# Patient Record
Sex: Female | Born: 1940
Health system: Southern US, Community
[De-identification: ages and names within clinical notes are randomized; demographics above are authoritative.]

## PROBLEM LIST (undated history)

## (undated) DIAGNOSIS — E538 Deficiency of other specified B group vitamins: Secondary | ICD-10-CM

## (undated) DIAGNOSIS — H4010X0 Unspecified open-angle glaucoma, stage unspecified: Secondary | ICD-10-CM

## (undated) DIAGNOSIS — J45909 Unspecified asthma, uncomplicated: Secondary | ICD-10-CM

## (undated) DIAGNOSIS — D649 Anemia, unspecified: Secondary | ICD-10-CM

## (undated) DIAGNOSIS — E119 Type 2 diabetes mellitus without complications: Secondary | ICD-10-CM

## (undated) DIAGNOSIS — M6520 Calcific tendinitis, unspecified site: Secondary | ICD-10-CM

---

## 2019-09-06 ENCOUNTER — Other Ambulatory Visit: Payer: Self-pay

## 2019-09-06 ENCOUNTER — Emergency Department (HOSPITAL_BASED_OUTPATIENT_CLINIC_OR_DEPARTMENT_OTHER)
Admission: EM | Admit: 2019-09-06 | Discharge: 2019-09-06 | Disposition: A | Discharge status: Home / Self Care | Payer: Medicare Other | Attending: Emergency Medicine | Admitting: Emergency Medicine

## 2019-09-06 ENCOUNTER — Encounter (HOSPITAL_BASED_OUTPATIENT_CLINIC_OR_DEPARTMENT_OTHER): Payer: Self-pay | Admitting: *Deleted

## 2019-09-06 DIAGNOSIS — T7840XA Allergy, unspecified, initial encounter: Secondary | ICD-10-CM | POA: Diagnosis present

## 2019-09-06 DIAGNOSIS — Z7982 Long term (current) use of aspirin: Secondary | ICD-10-CM | POA: Insufficient documentation

## 2019-09-06 DIAGNOSIS — Z79899 Other long term (current) drug therapy: Secondary | ICD-10-CM | POA: Diagnosis not present

## 2019-09-06 DIAGNOSIS — R220 Localized swelling, mass and lump, head: Secondary | ICD-10-CM | POA: Insufficient documentation

## 2019-09-06 HISTORY — DX: Anemia, unspecified: D64.9

## 2019-09-06 HISTORY — DX: Unspecified open-angle glaucoma, stage unspecified: H40.10X0

## 2019-09-06 HISTORY — DX: Type 2 diabetes mellitus without complications: E11.9

## 2019-09-06 HISTORY — DX: Calcific tendinitis, unspecified site: M65.20

## 2019-09-06 HISTORY — DX: Unspecified asthma, uncomplicated: J45.909

## 2019-09-06 HISTORY — DX: Deficiency of other specified B group vitamins: E53.8

## 2019-09-06 MED ORDER — DIPHENHYDRAMINE HCL 25 MG PO CAPS
25.0000 mg | ORAL_CAPSULE | Freq: Once | ORAL | Status: AC
  Administered 2019-09-06: 25 mg via ORAL
  Filled 2019-09-06: qty 1

## 2019-09-06 MED ORDER — DEXAMETHASONE 4 MG PO TABS
8.0000 mg | ORAL_TABLET | Freq: Once | ORAL | Status: AC
  Administered 2019-09-06: 8 mg via ORAL
  Filled 2019-09-06: qty 2

## 2019-09-06 NOTE — Discharge Instructions (Addendum)
Stop taking lisinopril. This class of blood pressure medication can sometimes cause a condition called angioedema. I can't tell you for sure that it is the reason for your lip swelling earlier today, but it's a possibility. You are on a low dose anyway and I do not want you taking it anymore. You need to talk with your doctor as they may want to put you on an alternative medication.   You can take benadryl if you feel like you have continued symptoms. Return to the ER if you feel like your symptoms are significantly worsening.

## 2019-09-06 NOTE — ED Provider Notes (Signed)
[facility] EMERGENCY DEPARTMENT Provider Note   CSN: [id number] Arrival date & time: 09/06/19  5038     History Chief Complaint  Patient presents with  . Allergic Reaction    Lisa Powell is a 79 y.o. female.  HPI   79 year old female with lip swelling.  She is concerned that she may be having allergic reaction.  She ate some shrimp last night although she has had shrimp on multiple other occasions without any problems.  She put some Benadryl cream on her lip and has since improved.  No sore throat or throat tightness.  No coughing or wheezing.  No GI complaints.  No dizziness or lightheadedness.  No other new/different exposures that she is aware of.  She is on an ACE inhibitor. Past Medical History:  Diagnosis Date  . Anemia reactive airway disease  . Calcific tendonitis   . Diabetes mellitus without complication (HCC)   . Open-angle glaucoma   . Reactive airway disease   . Vitamin B12 deficiency     There are no problems to display for this patient.   History reviewed. No pertinent surgical history.   OB History    Gravida  3   Para  2   Term      Preterm      AB  1   Living        SAB      TAB      Ectopic      Multiple      Live Births              Family History  Problem Relation Age of Onset  . Cancer Mother   . Diabetes Father     Social History   Tobacco Use  . Smoking status: Never Smoker  . Smokeless tobacco: Never Used  Substance Use Topics  . Alcohol use: Never  . Drug use: Never    Home Medications Prior to Admission medications   Medication Sig Start Date End Date Taking? Authorizing Provider  aspirin EC 81 MG tablet Take 81 mg by mouth daily.   Yes [provider]  atorvastatin (LIPITOR) 40 MG tablet Take 40 mg by mouth daily.   Yes [provider]  azelastine (OPTIVAR) 0.05 % ophthalmic solution 1 drop 2 (two) times daily.   Yes [provider]  ferrous sulfate 325 (65 FE) MG  tablet Take 325 mg by mouth daily with breakfast.   Yes [provider]  fluticasone (FLONASE) 50 MCG/ACT nasal spray Place into both nostrils daily.   Yes [provider]  latanoprost (XALATAN) 0.005 % ophthalmic solution 1 drop at bedtime.   Yes [provider]  lisinopril (ZESTRIL) 5 MG tablet Take 5 mg by mouth daily.   Yes [provider]  metFORMIN (GLUCOPHAGE) 1000 MG tablet Take 1,000 mg by mouth 2 (two) times daily with a meal.    [provider]  montelukast (SINGULAIR) 10 MG tablet Take 10 mg by mouth at bedtime.    [provider]    Allergies    Patient has no known allergies.  Review of Systems   Review of Systems All systems reviewed and negative, other than as noted in HPI.  Physical Exam Updated Vital Signs BP (!) 170/67 (BP Location: Right Arm)   Pulse 87   Temp 98.7 F (37.1 C) (Oral)   Resp 16   Ht 5\' 4"  (1.626 m)   Wt 68.1 kg   SpO2 99%  BMI 25.76 kg/m   Physical Exam Vitals and nursing note reviewed.  Constitutional:      General: She is not in acute distress.    Appearance: She is well-developed.  HENT:     Head: Normocephalic and atraumatic.     Mouth/Throat:     Comments: Oropharynx is clear.  No swelling appreciated on face or neck.  Normal sounding voice.  No stridor.  Neck supple. Eyes:     General:        Right eye: No discharge.        Left eye: No discharge.     Conjunctiva/sclera: Conjunctivae normal.  Cardiovascular:     Rate and Rhythm: Normal rate and regular rhythm.     Heart sounds: Normal heart sounds. No murmur. No friction rub. No gallop.   Pulmonary:     Effort: Pulmonary effort is normal. No respiratory distress.     Breath sounds: Normal breath sounds.  Abdominal:     General: There is no distension.     Palpations: Abdomen is soft.     Tenderness: There is no abdominal tenderness.  Musculoskeletal:        General: No tenderness.     Cervical back: Neck supple.    Skin:    General: Skin is warm and dry.  Neurological:     Mental Status: She is alert.  Psychiatric:        Behavior: Behavior normal.        Thought Content: Thought content normal.     ED Results / Procedures / Treatments   Labs (all labs ordered are listed, but only abnormal results are displayed) Labs Reviewed - No data to display  EKG None  Radiology No results found.  Procedures Procedures (including critical care time)  Medications Ordered in ED Medications - No data to display  ED Course  I have reviewed the triage vital signs and the nursing notes.  Pertinent labs & imaging results that were available during my care of the patient were reviewed by me and considered in my medical decision making (see chart for details).    MDM Rules/Calculators/A&P                      79 year old female with lip swelling.  She states that it is improved.  I do not appreciate any whatsoever on my exam.  Overall her exam is reassuring/normal.  Advise she continue to take Benadryl as needed.  Of note, she is on an ACE inhibitor.  She is on a small dose.  Advised her to just stop at this point as her lip swelling may potentially be angioedema related to this.  I cannot tell her for sure.  Advised her to follow-up with her PCP to discuss it further.  Final Clinical Impression(s) / ED Diagnoses Final diagnoses:  Lip swelling    Rx / DC Orders ED Discharge Orders    None       Virgel Manifold, MD 09/08/19 1153

## 2019-09-06 NOTE — ED Triage Notes (Signed)
Ate 2 shrimps last night and woke up with swollen lips and she applied Benadryl cream and the swollen has subsided.

## 2020-03-12 ENCOUNTER — Encounter (HOSPITAL_BASED_OUTPATIENT_CLINIC_OR_DEPARTMENT_OTHER): Payer: Self-pay

## 2020-03-12 ENCOUNTER — Other Ambulatory Visit: Payer: Self-pay

## 2020-03-12 ENCOUNTER — Emergency Department (HOSPITAL_BASED_OUTPATIENT_CLINIC_OR_DEPARTMENT_OTHER)
Admission: EM | Admit: 2020-03-12 | Discharge: 2020-03-12 | Disposition: A | Discharge status: Home / Self Care | Payer: No Typology Code available for payment source | Attending: Emergency Medicine | Admitting: Emergency Medicine

## 2020-03-12 ENCOUNTER — Emergency Department (HOSPITAL_BASED_OUTPATIENT_CLINIC_OR_DEPARTMENT_OTHER): Payer: No Typology Code available for payment source

## 2020-03-12 DIAGNOSIS — E119 Type 2 diabetes mellitus without complications: Secondary | ICD-10-CM | POA: Insufficient documentation

## 2020-03-12 DIAGNOSIS — Y92410 Unspecified street and highway as the place of occurrence of the external cause: Secondary | ICD-10-CM | POA: Insufficient documentation

## 2020-03-12 DIAGNOSIS — M4812 Ankylosing hyperostosis [Forestier], cervical region: Secondary | ICD-10-CM | POA: Insufficient documentation

## 2020-03-12 DIAGNOSIS — S199XXA Unspecified injury of neck, initial encounter: Secondary | ICD-10-CM | POA: Diagnosis present

## 2020-03-12 DIAGNOSIS — Y9389 Activity, other specified: Secondary | ICD-10-CM | POA: Insufficient documentation

## 2020-03-12 DIAGNOSIS — M4810 Ankylosing hyperostosis [Forestier], site unspecified: Secondary | ICD-10-CM

## 2020-03-12 DIAGNOSIS — M545 Low back pain, unspecified: Secondary | ICD-10-CM

## 2020-03-12 DIAGNOSIS — Y999 Unspecified external cause status: Secondary | ICD-10-CM | POA: Diagnosis not present

## 2020-03-12 DIAGNOSIS — S161XXA Strain of muscle, fascia and tendon at neck level, initial encounter: Secondary | ICD-10-CM

## 2020-03-12 MED ORDER — CYCLOBENZAPRINE HCL 10 MG PO TABS: 10.0000 mg | ORAL_TABLET | Freq: Two times a day (BID) | ORAL | 0 refills | Status: DC | PRN

## 2020-03-12 MED ORDER — LIDOCAINE 5 % EX PTCH: 1.0000 | MEDICATED_PATCH | CUTANEOUS | 0 refills | Status: DC

## 2020-03-12 MED FILL — LIDOCAINE PATCH 5%: 5 | 30 days supply | Qty: 30 | Fill #0

## 2020-03-12 MED FILL — CYCLOBENZAPRINE HCL 10 MG T: 10 | 10 days supply | Qty: 20 | Fill #0
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
## 2022-06-02 ENCOUNTER — Other Ambulatory Visit: Payer: Self-pay

## 2022-06-02 ENCOUNTER — Emergency Department (HOSPITAL_BASED_OUTPATIENT_CLINIC_OR_DEPARTMENT_OTHER): Payer: Medicare Other

## 2022-06-02 ENCOUNTER — Emergency Department (HOSPITAL_BASED_OUTPATIENT_CLINIC_OR_DEPARTMENT_OTHER)
Admission: EM | Admit: 2022-06-02 | Discharge: 2022-06-02 | Disposition: A | Discharge status: Home / Self Care | Payer: Medicare Other | Attending: Emergency Medicine | Admitting: Emergency Medicine

## 2022-06-02 ENCOUNTER — Encounter (HOSPITAL_BASED_OUTPATIENT_CLINIC_OR_DEPARTMENT_OTHER): Payer: Self-pay

## 2022-06-02 DIAGNOSIS — R791 Abnormal coagulation profile: Secondary | ICD-10-CM | POA: Insufficient documentation

## 2022-06-02 DIAGNOSIS — Z7982 Long term (current) use of aspirin: Secondary | ICD-10-CM | POA: Insufficient documentation

## 2022-06-02 DIAGNOSIS — I10 Essential (primary) hypertension: Secondary | ICD-10-CM | POA: Diagnosis not present

## 2022-06-02 DIAGNOSIS — Z7984 Long term (current) use of oral hypoglycemic drugs: Secondary | ICD-10-CM | POA: Insufficient documentation

## 2022-06-02 DIAGNOSIS — E119 Type 2 diabetes mellitus without complications: Secondary | ICD-10-CM | POA: Diagnosis not present

## 2022-06-02 DIAGNOSIS — Z79899 Other long term (current) drug therapy: Secondary | ICD-10-CM | POA: Diagnosis not present

## 2022-06-02 DIAGNOSIS — R0789 Other chest pain: Secondary | ICD-10-CM | POA: Insufficient documentation

## 2022-06-02 DIAGNOSIS — R079 Chest pain, unspecified: Secondary | ICD-10-CM

## 2022-06-02 LAB — CBC
HCT: 30.6 % — ABNORMAL LOW (ref 36.0–46.0)
Hemoglobin: 10.3 g/dL — ABNORMAL LOW (ref 12.0–15.0)
MCH: 30.6 pg (ref 26.0–34.0)
MCHC: 33.7 g/dL (ref 30.0–36.0)
MCV: 90.8 fL (ref 80.0–100.0)
Platelets: 316 10*3/uL (ref 150–400)
RBC: 3.37 MIL/uL — ABNORMAL LOW (ref 3.87–5.11)
RDW: 13.4 % (ref 11.5–15.5)
WBC: 5.7 10*3/uL (ref 4.0–10.5)
nRBC: 0 % (ref 0.0–0.2)

## 2022-06-02 LAB — BASIC METABOLIC PANEL
Anion gap: 6 (ref 5–15)
BUN: 14 mg/dL (ref 8–23)
CO2: 27 mmol/L (ref 22–32)
Calcium: 9 mg/dL (ref 8.9–10.3)
Chloride: 108 mmol/L (ref 98–111)
Creatinine, Ser: 0.8 mg/dL (ref 0.44–1.00)
GFR, Estimated: >60 mL/min (ref >60)
Glucose, Bld: 189 mg/dL — ABNORMAL HIGH (ref 70–99)
Potassium: 3.8 mmol/L (ref 3.5–5.1)
Sodium: 141 mmol/L (ref 135–145)

## 2022-06-02 LAB — TROPONIN I (HIGH SENSITIVITY)
Troponin I (High Sensitivity): 4 ng/L (ref <18)
Troponin I (High Sensitivity): 4 ng/L (ref <18)

## 2022-06-02 LAB — D-DIMER, QUANTITATIVE: D-Dimer, Quant: 2.46 ug/mL-FEU — ABNORMAL HIGH (ref 0.00–0.50)

## 2022-06-02 MED ORDER — ASPIRIN 81 MG PO CHEW
324.0000 mg | CHEWABLE_TABLET | Freq: Once | ORAL | Status: AC
  Administered 2022-06-02: 324 mg via ORAL
  Filled 2022-06-02: qty 4

## 2022-06-02 MED ORDER — IOHEXOL 350 MG/ML SOLN
75.0000 mL | Freq: Once | INTRAVENOUS | Status: AC | PRN
  Administered 2022-06-02: 75 mL via INTRAVENOUS

## 2022-06-02 MED ORDER — ALUM & MAG HYDROXIDE-SIMETH 200-200-20 MG/5ML PO SUSP
30.0000 mL | Freq: Once | ORAL | Status: AC
  Administered 2022-06-02: 30 mL via ORAL
  Filled 2022-06-02: qty 30

## 2022-06-02 NOTE — Discharge Instructions (Signed)
Try pepcid or tagamet up to twice a day.  Try to avoid things that may make this worse, most commonly these are spicy foods tomato based products fatty foods chocolate and peppermint.  Alcohol and tobacco can also make this worse.  Return to the emergency department for sudden worsening pain fever or inability to eat or drink.  Follow up with your doctor in the office.  Return for worsening symptoms.
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
## 2022-06-02 NOTE — ED Notes (Signed)
Attempted PIVx2 unsuccessfully.

## 2022-06-02 NOTE — ED Notes (Signed)
Per CT IV became infiltrated and needs to be restarted which I did

## 2022-06-02 NOTE — ED Provider Notes (Signed)
[facility] EMERGENCY DEPARTMENT Provider Note   CSN: [id number] Arrival date & time: 06/02/22  5573     History  Chief Complaint  Patient presents with   Chest Pain    Lisa Powell is a 81 y.o. female.  81 yo F with a chief complaint of chest pain.  This woke her from sleep about 2 AM.  It like a discomfort across her chest.  No radiation no shortness of breath nausea vomiting no diaphoresis.  She got up out of bed and walk around and got a little bit better.  It has persisted.  Nothing seems to make it better or worse now.  Denies pain like this previously.  Patient denies history of MI, denies smoking.  Denies family history of MI.  Patient has a history of hypertension hyperlipidemia and diabetes.  Patient denies history of PE or DVT denies hemoptysis denies unilateral lower extremity edema denies recent surgery immobilization hospitalization estrogen use or history of cancer.  After the patient told me that she has not had any recent surgeries tell me that she just had cataracts removed about 3 weeks ago.     Chest Pain      Home Medications Prior to Admission medications   Medication Sig Start Date End Date Taking? Authorizing Provider  aspirin EC 81 MG tablet Take 81 mg by mouth daily.    [provider]  atorvastatin (LIPITOR) 40 MG tablet Take 40 mg by mouth daily.    [provider]  azelastine (OPTIVAR) 0.05 % ophthalmic solution 1 drop 2 (two) times daily.    [provider]  cyclobenzaprine (FLEXERIL) 10 MG tablet Take 1 tablet (10 mg total) by mouth 2 (two) times daily as needed for muscle spasms. 03/12/20   Alvira Monday, MD  ferrous sulfate 325 (65 FE) MG tablet Take 325 mg by mouth daily with breakfast.    [provider]  fluticasone (FLONASE) 50 MCG/ACT nasal spray Place into both nostrils daily.    [provider]  latanoprost (XALATAN) 0.005 % ophthalmic solution 1 drop at bedtime.    [provider]  lidocaine (LIDODERM) 5 % Place 1 patch onto the skin daily. Remove & Discard patch within 12 hours or as directed by MD 03/12/20   Alvira Monday, MD  lisinopril (ZESTRIL) 5 MG tablet Take 5 mg by mouth daily.    [provider]  metFORMIN (GLUCOPHAGE) 1000 MG tablet Take 1,000 mg by mouth 2 (two) times daily with a meal.    [provider]  montelukast (SINGULAIR) 10 MG tablet Take 10 mg by mouth at bedtime.    [provider]      Allergies    Patient has no known allergies.    Review of Systems   Review of Systems  Cardiovascular:  Positive for chest pain.    Physical Exam Updated Vital Signs BP (!) 160/69   Pulse 68   Temp 98.1 F (36.7 C) (Oral)   Resp (!) 32   Ht 5\' 4"  (1.626 m)   Wt 70.3 kg   SpO2 98%   BMI 26.61 kg/m  Physical Exam Vitals and nursing note reviewed.  Constitutional:      General: She is not in acute distress.    Appearance: She is well-developed. She is not diaphoretic.  HENT:     Head: Normocephalic and atraumatic.  Eyes:     Pupils: Pupils are equal, round, and reactive to light.  Cardiovascular:  Rate and Rhythm: Normal rate and regular rhythm.     Heart sounds: No murmur heard.    No friction rub. No gallop.  Pulmonary:     Effort: Pulmonary effort is normal.     Breath sounds: No wheezing or rales.  Chest:     Chest wall: No tenderness.  Abdominal:     General: There is no distension.     Palpations: Abdomen is soft.     Tenderness: There is no abdominal tenderness.  Musculoskeletal:        General: No tenderness.     Cervical back: Normal range of motion and neck supple.  Skin:    General: Skin is warm and dry.  Neurological:     Mental Status: She is alert and oriented to person, place, and time.  Psychiatric:        Behavior: Behavior normal.     ED Results / Procedures / Treatments   Labs (all labs ordered are listed, but only abnormal results are displayed) Labs Reviewed   BASIC METABOLIC PANEL - Abnormal; Notable for the following components:      Result Value   Glucose, Bld 189 (*)    All other components within normal limits  CBC - Abnormal; Notable for the following components:   RBC 3.37 (*)    Hemoglobin 10.3 (*)    HCT 30.6 (*)    All other components within normal limits  D-DIMER, QUANTITATIVE - Abnormal; Notable for the following components:   D-Dimer, Quant 2.46 (*)    All other components within normal limits  TROPONIN I (HIGH SENSITIVITY)  TROPONIN I (HIGH SENSITIVITY)    EKG EKG Interpretation  Date/Time:  Monday June 02 2022 06:30:16 EST Ventricular Rate:  70 PR Interval:  142 QRS Duration: 93 QT Interval:  388 QTC Calculation: 419 R Axis:   -2 Text Interpretation: Sinus rhythm Abnormal R-wave progression, early transition Left ventricular hypertrophy Confirmed by Geoffery Lyons ([id number]) on 06/02/2022 6:44:52 AM  Radiology CT Angio Chest PE W and/or Wo Contrast  Result Date: 06/02/2022 CLINICAL DATA:  Chest pain, positive D-dimer EXAM: CT ANGIOGRAPHY CHEST WITH CONTRAST TECHNIQUE: Multidetector CT imaging of the chest was performed using the standard protocol during bolus administration of intravenous contrast. Multiplanar CT image reconstructions and MIPs were obtained to evaluate the vascular anatomy. RADIATION DOSE REDUCTION: This exam was performed according to the departmental dose-optimization program which includes automated exposure control, adjustment of the mA and/or kV according to patient size and/or use of iterative reconstruction technique. CONTRAST:  56mL OMNIPAQUE IOHEXOL 350 MG/ML SOLN COMPARISON:  Chest radiograph done earlier today FINDINGS: Cardiovascular: There is homogeneous enhancement in thoracic aorta. There are scattered atherosclerotic plaques minimal calcifications in thoracic aorta. There are no intraluminal filling defects in pulmonary artery branches. Mediastinum/Nodes: No significant lymphadenopathy  seen. Lungs/Pleura: There is no focal pulmonary consolidation. There is pleural thickening in the posterior left lower lung field. Density measurements are less than 0 Hounsfield units. Findings suggest possible prominent extrapleural fat are pleural thickening or minimal effusion. There is no pneumothorax. Upper Abdomen: There is fatty infiltration in the liver. Diverticula are seen in splenic flexure. Musculoskeletal: Degenerative changes are noted in the visualized thoracic and upper lumbar spine. Degenerative changes are noted in both shoulders. Review of the MIP images confirms the above findings. IMPRESSION: There is no evidence of pulmonary artery embolism. There is no evidence of thoracic aortic dissection. There is no focal pulmonary consolidation. There is asymmetric pleural density in the posterior  left mid and left lower lung fields suggesting minimal pleural effusion or pleural thickening from chronic inflammation. There is no pneumothorax. Fatty liver.  Diverticulosis of colon. Electronically Signed   By: Ernie Avena M.D.   On: 06/02/2022 09:40   DG Chest 2 View  Result Date: 06/02/2022 CLINICAL DATA:  Chest pain radiating to LEFT shoulder and clavicle EXAM: CHEST - 2 VIEW COMPARISON:  None Available. FINDINGS: Normal heart size, mediastinal contours, and pulmonary vascularity. Lungs clear. No pulmonary infiltrate, pleural effusion, or pneumothorax. Degenerative disc disease changes and diffuse idiopathic skeletal hyperostosis of thoracic spine. Question mildly increased bone mineral density for age. IMPRESSION: No acute abnormalities. Electronically Signed   By: Ulyses Southward M.D.   On: 06/02/2022 07:28    Procedures Procedures    Medications Ordered in ED Medications  alum & mag hydroxide-simeth (MAALOX/MYLANTA) 200-200-20 MG/5ML suspension 30 mL (30 mLs Oral Given 06/02/22 0747)  iohexol (OMNIPAQUE) 350 MG/ML injection 75 mL (75 mLs Intravenous Contrast Given 06/02/22 0847)   aspirin chewable tablet 324 mg (324 mg Oral Given 06/02/22 1059)    ED Course/ Medical Decision Making/ A&P                           Medical Decision Making Amount and/or Complexity of Data Reviewed Labs: ordered. Radiology: ordered.  Risk OTC drugs. Prescription drug management.   81 yo F with a chief complaint of chest pain.  This woke her from sleep about 2 AM.  Symptoms have improved since.  Not obviously exertional but has not done much since the onset of her discomfort.  Will obtain 2 troponins.  Chest x-ray independently interpreted by me without focal infiltrate or pneumothorax.  EKG nonischemic.  With recent eye procedure will obtain a D-dimer.  Very mild anemia, initial troponin negative.  No significant electrolyte abnormalities.  DDimer elevated.  CT angiogram of the chest negative for pulmonary embolism.  Negative for occult pneumonia.  2 troponins are negative.  Will discharge home.  PCP follow-up.  11:35 AM:  I have discussed the diagnosis/risks/treatment options with the patient and family.  Evaluation and diagnostic testing in the emergency department does not suggest an emergent condition requiring admission or immediate intervention beyond what has been performed at this time.  They will follow up with PCP. We also discussed returning to the ED immediately if new or worsening sx occur. We discussed the sx which are most concerning (e.g., sudden worsening pain, fever, inability to tolerate by mouth) that necessitate immediate return. Medications administered to the patient during their visit and any new prescriptions provided to the patient are listed below.  Medications given during this visit Medications  alum & mag hydroxide-simeth (MAALOX/MYLANTA) 200-200-20 MG/5ML suspension 30 mL (30 mLs Oral Given 06/02/22 0747)  iohexol (OMNIPAQUE) 350 MG/ML injection 75 mL (75 mLs Intravenous Contrast Given 06/02/22 0847)  aspirin chewable tablet 324 mg (324 mg Oral Given  06/02/22 1059)     The patient appears reasonably screen and/or stabilized for discharge and I doubt any other medical condition or other [facility] requiring further screening, evaluation, or treatment in the ED at this time prior to discharge.          Final Clinical Impression(s) / ED Diagnoses Final diagnoses:  Nonspecific chest pain    Rx / DC Orders ED Discharge Orders     None         Melene Plan, DO 06/02/22 1135

## 2023-08-24 NOTE — Progress Notes (Signed)
 [facility]  - [facility]IC[facility] [place] Follow Up Visit Lisa Powell DOB: 1940-10-05  MRN: [id number]  Visit Date: 08/24/2023 Encounter Provider: Tinnie Almarie Forts, PA-C  PCP: Tinnie Almarie Forts, PA-C  Subjective:    Chief Complaint  Patient presents with  . Diabetes    Pt states she is fasting and doesn't need her meds refilled.   Lisa Powell presents today for management of chronic medical problems that are all stable. Reports taking medications as prescribed/directed and feels well today.  Hypertension Patient is here for follow-up of elevated blood pressure. She is taking medication as prescribed and feels well today. Denies side effects from the medication. Patient denies chest pain, dyspnea, exertional chest pressure/discomfort, palpitations, syncope and worsening edema.  BP Readings from Last 3 Encounters:  08/24/23 133/76  03/31/23 133/66  12/29/22 140/72   Hyperlipidemia Presents in follow up of her cholesterol. Current lipid lowering medications reviewed. Tolerating medication well. No side effects. No myalgias. Lipid panel is current. Lab Results  Component Value Date   CHOL 165 03/31/2023   TRIG 91 03/31/2023   HDL 44 (L) 03/31/2023   LDLCALC 102 (H) 03/31/2023   Diabetes Presents in follow up of her diabetes. Patient's most recent labs include: Lab Results  Component Value Date   HGBA1C 7.2 (H) 03/31/2023   HGBA1C 6.8 (H) 12/29/2022   HGBA1C 7.2 (H) 09/26/2022   Patient does not check home blood sugars.   Home blood sugars: patient does not check sugars Hypoglycemic episodes: No On ACE-I/ARB: No On statin therapy: Yes Sees opthalmology/optometry: Yes Patient reports no side effects to diabetic medications. Patient has no new complaints of foot numbness or burning or loss of sensation. Pt reports no lesions on the feet currently.   The following portions of the patient's history were reviewed and updated as  appropriate: Patient Active Problem List  Diagnosis  . Hyperlipidemia LDL goal <100  . Essential hypertension  . Vitamin B 12 deficiency  . Anemia  . RAD (reactive airway disease)  . Open-angle glaucoma  . Calcific tendonitis  . Seasonal allergies  . Nasal congestion  . Impingement syndrome of right shoulder  . Arthritis of right acromioclavicular joint  . Colonoscopy refused  . Sickle cell trait (HCC)  . Statin intolerance  . Type 2 diabetes mellitus without complication (CMD)    Current Outpatient Medications on File Prior to Visit  Medication Sig Dispense Refill  . azelastine (OPTIVAR) 0.05 % drop ophthalmic solution Administer 1 drop into each eyes 2 (two) times a day. 6 mL 3  . dorzolamide-timoloL (COSOPT) 22.3-6.8 mg/mL ophthalmic solution INSTILL 1 DROP BOTH EYES TWICE DAILY    . latanoprost (XALATAN) 0.005 % ophthalmic solution INT 1 GTT IN OU HS    . metFORMIN (GLUCOPHAGE) 1,000 mg tablet TAKE 1 TABLET BY MOUTH TWICE  DAILY WITH MEALS 180 tablet 3  . pravastatin (PRAVACHOL) 40 mg tablet TAKE 1 TABLET BY MOUTH DAILY 90 tablet 3  . turmeric 400 mg cap Take  by mouth.    SABRA glucose blood (Blood Glucose Test) test strip Use as instructed (Patient not taking: Reported on 08/24/2023) 100 strip 5  . glucose monitoring kit kit Use as instructed to check blood sugars up to twice daily. Diagnosis code: E11.9 (Patient not taking: Reported on 08/24/2023) 1 each 1  . lancets 33 gauge misc Use as instructed to check blood sugars up to twice daily. Diagnosis code: E11.9 (Patient not taking: Reported on 08/24/2023) 100 each 5  Current Facility-Administered Medications on File Prior to Visit  Medication Dose Route Frequency Provider Last Rate Last Admin  . cyanocobalamin (VITAMIN B12) injection 1,000 mcg  1,000 mcg intramuscular Q30 Days Lauren Almarie Forts, PA-C   1,000 mcg at 07/07/23 9163    She  has a past surgical history that includes No past surgeries. Her family history includes  Breast cancer (age of onset: 67) in some other family members; Breast cancer (age of onset: 23) in her mother; Breast cancer (age of onset: 10) in her sister; Breast cancer (age of onset: 14) in her sister. She is allergic to lisinopril and atorvastatin..   Review of Systems - History obtained from the patient Complete ROS negative except those noted in the HPI or elsewhere in note  Objective  Objective    Physical Exam Vital signs and nursing note reviewed. BP 133/76   Pulse 67   Temp 98 F (36.7 C) (Temporal)   Ht 1.626 m (5' 4)   Wt 67.2 kg (148 lb 3.2 oz)   SpO2 99%   Breastfeeding No   BMI 25.44 kg/m    General appearance: alert, appears stated age and cooperative Head: Normocephalic and atrauamatic Eyes: Conjunctiva clear, EOMs intact, no scleral icterus noted Neck: no adenopathy, no carotid bruit, no JVD; neck is supple, symmetrical; trachea midline and thyroid not enlarged, symmetric, no tenderness/mass/nodules Heart: regular rate and rhythm, S1, S2 normal, no murmur, click, rub or gallop Lungs: clear to auscultation bilaterally; no wheezes, rhonchi, rales and in no acute distress. Breathing is unlabored. Extremities: extremities normal, atraumatic, no cyanosis or edema Pulses: 2+ and symmetric  Psych: Mood and affect appropriate Skin: color, texture, and turgor normal; no rashes or lesions   Recent lab work reviewed and analyzed: Lab Results  Component Value Date   WBC 4.80 03/31/2023   HGB 10.6 (L) 03/31/2023   HCT 30.8 (L) 03/31/2023   MCV 91.4 03/31/2023   PLT 323 03/31/2023   ALT 11 03/31/2023   AST 16 03/31/2023   CREATININE 0.68 03/31/2023   BUN 12 03/31/2023   EGFR 88 03/31/2023   NA 141 03/31/2023   K 4.2 03/31/2023   CL 106 03/31/2023   CO2 28 03/31/2023   VITAMINB12 770 03/31/2023   HGBA1C 7.2 (H) 03/31/2023   LDLCALC 102 (H) 03/31/2023      Recent imaging reviewed and analyzed: MG Breast Screening Tomo Bilat Narrative: CLINICAL DATA:   Screening.  EXAM: DIGITAL SCREENING BILATERAL MAMMOGRAM WITH TOMOSYNTHESIS AND CAD  TECHNIQUE: Bilateral screening digital craniocaudal and mediolateral oblique mammograms were obtained. Bilateral screening digital breast tomosynthesis was performed. The images were evaluated with computer-aided detection.  COMPARISON:  Previous exam(s).  ACR Breast Density Category b: There are scattered areas of fibroglandular density.  FINDINGS: There are no findings suspicious for malignancy. Impression: No mammographic evidence of malignancy. A result letter of this screening mammogram will be mailed directly to the patient.  RECOMMENDATION: Screening mammogram in one year. (Code:[id number]-B-01Y)  BI-RADS CATEGORY  1: Negative.  Electronically Signed   By: Reyes Phi M.D.   On: 06/05/2023 16:44   Results for orders placed or performed in visit on 03/31/23  Comprehensive Metabolic Panel   Collection Time: 03/31/23 11:37 AM  Result Value Ref Range   Sodium 141 136 - 145 mmol/L   Potassium 4.2 3.5 - 5.1 mmol/L   Chloride 106 98 - 107 mmol/L   CO2 28 21 - 31 mmol/L   Anion Gap 7 6 - 14 mmol/L  Glucose, Random 104 (H) 70 - 99 mg/dL   Blood Urea Nitrogen (BUN) 12 7 - 25 mg/dL   Creatinine 9.31 9.39 - 1.20 mg/dL   eGFR 88 >40 fO/fpw/8.26f7   Albumin 4.1 3.5 - 5.7 g/dL   Total Protein 7.0 6.4 - 8.9 g/dL   Bilirubin, Total 0.8 0.3 - 1.0 mg/dL   Alkaline Phosphatase (ALP) 50 34 - 104 U/L   Aspartate Aminotransferase (AST) 16 13 - 39 U/L   Alanine Aminotransferase (ALT) 11 7 - 52 U/L   Calcium 9.2 8.6 - 10.3 mg/dL   BUN/Creatinine Ratio    Lipid Panel   Collection Time: 03/31/23 11:37 AM  Result Value Ref Range   Cholesterol, Total, Lipid Panel 165 <200 mg/dL   Triglycerides, Lipid Panel 91 <150 mg/dL   HDL Cholesterol - Lipid Panel 44 (L) >=60 mg/dL   LDL Cholesterol, Calculated 102 (H) <100 mg/dL   Non-HDL Cholesterol 878 mg/dL  TSH With Reflex To Free T4   Collection Time:  03/31/23 11:37 AM  Result Value Ref Range   TSH 1.104 0.450 - 5.330 uIU/mL  Hemoglobin A1C With Estimated Average Glucose   Collection Time: 03/31/23 11:37 AM  Result Value Ref Range   Hemoglobin A1c 7.2 (H) <5.7 %   Estimated Average Glucose 160 mg/dL  Vitamin A87   Collection Time: 03/31/23 11:37 AM  Result Value Ref Range   Vitamin B-12 770 180 - 914 pg/mL  Albumin, Random Urine   Collection Time: 03/31/23 11:37 AM  Result Value Ref Range   Albumin, Urine 37 Not Established mg/L   Creatinine, Urine 37 >=20 mg/dL   Albumin/Creatinine Ratio, Urine 100 (H) 0 - 30 mg/g creat  CBC with Differential   Collection Time: 03/31/23 11:37 AM  Result Value Ref Range   WBC 4.80 4.40 - 11.00 10*3/uL   RBC 3.38 (L) 4.10 - 5.10 10*6/uL   Hemoglobin 10.6 (L) 12.3 - 15.3 g/dL   Hematocrit 69.1 (L) 64.0 - 44.6 %   Mean Corpuscular Volume (MCV) 91.4 80.0 - 96.0 fL   Mean Corpuscular Hemoglobin (MCH) 31.4 27.5 - 33.2 pg   Mean Corpuscular Hemoglobin Conc (MCHC) 34.4 33.0 - 37.0 g/dL   Red Cell Distribution Width (RDW) 14.3 12.3 - 17.0 %   Platelet Count (PLT) 323 150 - 450 10*3/uL   Mean Platelet Volume (MPV) 8.4 6.8 - 10.2 fL   Neutrophils % 50 %   Lymphocytes % 40 %   Monocytes % 9 %   Eosinophils % 1 %   Basophils % 1 %   nRBC % 0 %   Neutrophils Absolute 2.40 1.80 - 7.80 10*3/uL   Lymphocytes # 1.90 1.00 - 4.80 10*3/uL   Monocytes # 0.40 0.00 - 0.80 10*3/uL   Eosinophils # 0.00 0.00 - 0.50 10*3/uL   Basophils # 0.00 0.00 - 0.20 10*3/uL   nRBC Absolute 0.00 <=0.00 10*3/uL        Assessment/Plan    1. Essential hypertension (Primary) Well controlled <140/90. Continue current medications. Blood Pressure Target Goal: The goal for blood pressure is less than 140/90.   If you are checking your blood pressure at home, please record it and bring it to your next office visit. Following the Dietary Approaches to Stop Hypertension (DASH) diet (3 servings of fruit and vegetables daily,  whole grains, low sodium, low-fat proteins) and exercising for 30 minutes 3-4 times per week is also generally recommended for those with high blood pressure. - Comprehensive Metabolic Panel; Future -  TSH With Reflex To Free T4; Future - Comprehensive Metabolic Panel - TSH With Reflex To Free T4  2. Type 2 diabetes mellitus without complication, without long-term current use of insulin (CMD) Recheck labs. Continue current medication and will contact with any necessary dose changes based on results. Diabetic Goals : It is recommended that all diabetics follow a healthy, diabetic diet and exercise for 30 minutes 3-4 times per week (walking, biking, swimming, or machine).  If you are currently monitoring your blood glucose readings, bring that record with you to be reviewed at your next office visit.  Hgb A1-C goal<7% and LDL goal<100. We recommend an eye exam once per year and regular foot exams to check for wounds or ulcers. Eat regular snacks and avoid prolonged fasting to prevent hypoglycemia. If you develop hypoglycemia, call us  immediately.  - Hemoglobin A1C With Estimated Average Glucose; Future - Comprehensive Metabolic Panel; Future - Hemoglobin A1C With Estimated Average Glucose - Comprehensive Metabolic Panel  3. Hyperlipidemia LDL goal <100 Tolerating medications well. Recheck labs. Continue current medication and will contact with any necessary dose changes based on results. - Comprehensive Metabolic Panel; Future - Lipid Panel; Future - Comprehensive Metabolic Panel - Lipid Panel   Patient expresses understanding of their current medications and use.  If a new prescription was given today, then I discussed potential side effects, drug interactions, instructions for taking the medication, and the consequences of not taking it.   Patient verbalized an understanding of these instructions. Patient's medical and personal goals were discussed today. Barriers to current goals: none  evident The following portions of the patient's history were reviewed and updated as appropriate: allergies, current medications, past family history, past medical history, past social history, past surgical history and problem list. Return in about 6 months (around 02/21/2024) for DM, HLD, HTN. Call sooner if need arises.   No future appointments.   This document serves as a record of services personally performed by Tinnie Forts, PA-C.  It was created on their behalf by Ona LITTIE Mandril, CMA, a trained medical scribe, and Certified Medical Assistant (CMA). During the course of documenting the history, physical exam and medical decision making, I was functioning as a stage manager. The creation of this record is the provider's dictation and/or activities during the visit.   Electronically signed by Ona LITTIE Mandril, CMA 08/24/2023 9:41 AM    I agree the documentation is accurate and complete.  Electronically signed by: Tinnie Almarie Forts, PA-C 08/24/2023 10:18 AM

## 2024-02-22 NOTE — Progress Notes (Signed)
 [facility]  - [facility]NAL MEDICINE [place] Follow Up Visit Lisa Powell DOB: 1941/04/21  MRN: [id number]  Visit Date: 02/22/2024 Encounter Provider: Tinnie Almarie Forts, PA-C  PCP: Tinnie Almarie Forts, PA-C  Subjective:    Chief Complaint  Patient presents with  . Diabetes    Pt states she is fasting and doesn't need her meds refilled.     Hypertension Patient is here for follow-up of elevated blood pressure. She is taking medication as prescribed and feels well today. Denies side effects from the medication. Patient denies chest pain, dyspnea, exertional chest pressure/discomfort, palpitations, syncope and worsening edema.  BP Readings from Last 3 Encounters:  02/22/24 135/67  08/24/23 133/76  03/31/23 133/66   Hyperlipidemia Presents in follow up of her cholesterol. Current lipid lowering medications reviewed. Tolerating medication well. No side effects. No myalgias. Lipid panel is current. Lab Results  Component Value Date   CHOL 172 08/24/2023   TRIG 90 08/24/2023   HDL 49 (L) 08/24/2023   LDLCALC 105 (H) 08/24/2023   Diabetes Presents in follow up of her diabetes. Patient's most recent labs include: Lab Results  Component Value Date   HGBA1C 6.8 (H) 08/24/2023   HGBA1C 7.2 (H) 03/31/2023   HGBA1C 6.8 (H) 12/29/2022   Patient does not check home blood sugars.   Home blood sugars: patient does not check sugars Hypoglycemic episodes: No On ACE-I/ARB: No On statin therapy: Yes Sees opthalmology/optometry: Yes Patient reports no side effects to diabetic medications. Patient has no new complaints of foot numbness or burning or loss of sensation. Pt reports no lesions on the feet currently. Wt Readings from Last 3 Encounters:  02/22/24 67.6 kg (149 lb)  08/24/23 67.2 kg (148 lb 3.2 oz)  03/31/23 70.8 kg (156 lb)     The following portions of the patient's history were reviewed and updated as appropriate: Patient Active Problem  List   Diagnosis Date Noted  . Type 2 diabetes mellitus without complication    (CMD) 07/07/2022  . Statin intolerance 04/07/2022  . Sickle cell trait (HCC) 01/01/2021  . Colonoscopy refused 09/03/2020  . Impingement syndrome of right shoulder 03/29/2020  . Arthritis of right acromioclavicular joint 03/29/2020  . Seasonal allergies 06/29/2019  . Nasal congestion 06/29/2019  . Calcific tendonitis 05/06/2019  . Open-angle glaucoma 09/18/2017  . RAD (reactive airway disease) 08/28/2015  . Vitamin B 12 deficiency 04/06/2015  . Anemia 09/11/2014  . Hyperlipidemia LDL goal <100 09/03/2013  . Essential hypertension 04/13/2013     Current Outpatient Medications  Medication Instructions  . azelastine (OPTIVAR) 0.05 % drop ophthalmic solution 1 drop, Both Eyes, 2 times daily  . dorzolamide-timoloL (COSOPT) 22.3-6.8 mg/mL ophthalmic solution INSTILL 1 DROP BOTH EYES TWICE DAILY  . glucose blood (Blood Glucose Test) test strip Use as instructed  . glucose monitoring kit kit Use as instructed to check blood sugars up to twice daily. Diagnosis code: E11.9  . lancets 33 gauge misc Use as instructed to check blood sugars up to twice daily. Diagnosis code: E11.9  . latanoprost (XALATAN) 0.005 % ophthalmic solution INT 1 GTT IN OU HS  . metFORMIN (GLUCOPHAGE) 1,000 mg, oral, 2 times daily with meals  . pravastatin (PRAVACHOL) 40 mg, oral, Daily  . turmeric 400 mg cap Take  by mouth.      She  has a past surgical history that includes No past surgeries. Her family history includes Breast cancer (age of onset: 57) in some other family members; Breast cancer (age  of onset: 26) in her mother; Breast cancer (age of onset: 55) in her sister; Breast cancer (age of onset: 64) in her sister. She is allergic to lisinopril and atorvastatin..   Review of Systems - History obtained from the patient Complete ROS negative except those noted in the HPI or elsewhere in note  Objective  Objective    Physical  Exam Vital signs and nursing note reviewed. BP 135/67   Pulse 57   Temp 97.8 F (36.6 C) (Temporal)   Ht 1.626 m (5' 4)   Wt 67.6 kg (149 lb)   SpO2 99%   Breastfeeding No   BMI 25.58 kg/m   General appearance: alert, appears stated age and cooperative Head: Normocephalic and atrauamatic Eyes: Conjunctiva clear, EOMs intact, no scleral icterus noted Neck: no adenopathy, no carotid bruit, no JVD; neck is supple, symmetrical; trachea midline and thyroid not enlarged, symmetric, no tenderness/mass/nodules Heart: regular rate and rhythm, S1, S2 normal, no murmur, click, rub or gallop Lungs: clear to auscultation bilaterally; no wheezes, rhonchi, rales and in no acute distress. Breathing is unlabored. Extremities: extremities normal, atraumatic, no cyanosis or edema Pulses: 2+ and symmetric  Psych: Mood and affect appropriate Skin: color, texture, and turgor normal; no visualized rashes or lesions   Recent lab work reviewed and analyzed: Lab Results  Component Value Date   WBC 4.80 03/31/2023   HGB 10.6 (L) 03/31/2023   HCT 30.8 (L) 03/31/2023   MCV 91.4 03/31/2023   PLT 323 03/31/2023   ALT 12 08/24/2023   AST 18 08/24/2023   CREATININE 0.81 08/24/2023   BUN 11 08/24/2023   EGFR 73 08/24/2023   NA 141 08/24/2023   K 4.5 08/24/2023   CL 107 08/24/2023   CO2 28 08/24/2023   VITAMINB12 770 03/31/2023   HGBA1C 6.8 (H) 08/24/2023   LDLCALC 105 (H) 08/24/2023     Recent imaging reviewed and analyzed: MG Breast Screening Tomo Bilat Narrative: CLINICAL DATA:  Screening.  EXAM: DIGITAL SCREENING BILATERAL MAMMOGRAM WITH TOMOSYNTHESIS AND CAD  TECHNIQUE: Bilateral screening digital craniocaudal and mediolateral oblique mammograms were obtained. Bilateral screening digital breast tomosynthesis was performed. The images were evaluated with computer-aided detection.  COMPARISON:  Previous exam(s).  ACR Breast Density Category b: There are scattered areas  of fibroglandular density.  FINDINGS: There are no findings suspicious for malignancy. Impression: No mammographic evidence of malignancy. A result letter of this screening mammogram will be mailed directly to the patient.  RECOMMENDATION: Screening mammogram in one year. (Code:[id number])  BI-RADS CATEGORY  1: Negative.  Electronically Signed   By: Reyes Phi M.D.   On: 06/05/2023 16:44   Results for orders placed or performed in visit on 08/24/23  Hemoglobin A1C With Estimated Average Glucose   Collection Time: 08/24/23 10:16 AM  Result Value Ref Range   Hemoglobin A1c 6.8 (H) <5.7 %   Estimated Average Glucose 148 mg/dL  Comprehensive Metabolic Panel   Collection Time: 08/24/23 10:16 AM  Result Value Ref Range   Sodium 141 136 - 145 mmol/L   Potassium 4.5 3.5 - 5.1 mmol/L   Chloride 107 98 - 107 mmol/L   CO2 28 21 - 31 mmol/L   Anion Gap 6 6 - 14 mmol/L   Glucose, Random 143 (H) 70 - 99 mg/dL   Blood Urea Nitrogen (BUN) 11 7 - 25 mg/dL   Creatinine 9.18 9.39 - 1.20 mg/dL   eGFR 73 >40 fO/fpw/8.26f7   Albumin 4.4 3.5 - 5.7 g/dL   Total  Protein 7.3 6.4 - 8.9 g/dL   Bilirubin, Total 0.7 0.3 - 1.0 mg/dL   Alkaline Phosphatase (ALP) 48 34 - 104 U/L   Aspartate Aminotransferase (AST) 18 13 - 39 U/L   Alanine Aminotransferase (ALT) 12 7 - 52 U/L   Calcium 9.7 8.6 - 10.3 mg/dL   BUN/Creatinine Ratio    Lipid Panel   Collection Time: 08/24/23 10:16 AM  Result Value Ref Range   Cholesterol, Total, Lipid Panel 172 <200 mg/dL   Triglycerides, Lipid Panel 90 <150 mg/dL   HDL Cholesterol - Lipid Panel 49 (L) >=60 mg/dL   LDL Cholesterol, Calculated 105 (H) <100 mg/dL   Non-HDL Cholesterol 876 mg/dL  TSH With Reflex To Free T4   Collection Time: 08/24/23 10:16 AM  Result Value Ref Range   TSH 1.464 0.450 - 5.330 uIU/mL        Assessment/Plan    1. Essential hypertension Well controlled <140/90. Continue current medications. Blood Pressure Target Goal: The goal for  blood pressure is less than 140/90.   If you are checking your blood pressure at home, please record it and bring it to your next office visit. Following the Dietary Approaches to Stop Hypertension (DASH) diet (3 servings of fruit and vegetables daily, whole grains, low sodium, low-fat proteins) and exercising for 30 minutes 3-4 times per week is also generally recommended for those with high blood pressure. - TSH With Reflex To Free T4; Future - TSH With Reflex To Free T4  2. Hyperlipidemia LDL goal <100 Tolerating medications well. Recheck labs. Continue current medication and will contact with any necessary dose changes based on results. - Comprehensive Metabolic Panel; Future - Lipid Panel; Future - Comprehensive Metabolic Panel - Lipid Panel  3. Vitamin B 12 deficiency Update  - CBC with Differential; Future - Vitamin B12; Future - CBC with Differential - Vitamin B12  4. Type 2 diabetes mellitus without complication, without long-term current use of insulin    (CMD) (Primary) Recheck labs. Continue current medication and will contact with any necessary dose changes based on results. Diabetic Goals : It is recommended that all diabetics follow a healthy, diabetic diet and exercise for 30 minutes 3-4 times per week (walking, biking, swimming, or machine).  If you are currently monitoring your blood glucose readings, bring that record with you to be reviewed at your next office visit.  Hgb A1-C goal<7% and LDL goal<100. We recommend an eye exam once per year and regular foot exams to check for wounds or ulcers. Eat regular snacks and avoid prolonged fasting to prevent hypoglycemia. If you develop hypoglycemia, call us  immediately.  - Hemoglobin A1C With Estimated Average Glucose; Future - Albumin, Random Urine; Future - Hemoglobin A1C With Estimated Average Glucose - Albumin, Random Urine  5. Chronic anemia 6. Sickle cell trait [facility]) Monitor    Patient expresses understanding of  their current medications and use.  If a new prescription was given today, then I discussed potential side effects, drug interactions, instructions for taking the medication, and the consequences of not taking it.   Patient verbalized an understanding of these instructions. Patient's medical and personal goals were discussed today. Barriers to current goals: none evident The following portions of the patient's history were reviewed and updated as appropriate: allergies, current medications, past family history, past medical history, past social history, past surgical history and problem list. Return in about 4 months (around 06/23/2024) for MWV. Call sooner if need arises.   Future Appointments  Date Time  Provider Department Center  06/27/2024 10:40 AM Lauren Almarie Forts, PA-C [facility] [place] [facility] [address]     This document serves as a record of services personally performed by Tinnie Forts, PA-C.  It was created on their behalf by Ona LITTIE Mandril, CMA, a trained medical scribe, and Certified Medical Assistant (CMA). During the course of documenting the history, physical exam and medical decision making, I was functioning as a stage manager. The creation of this record is the provider's dictation and/or activities during the visit.   Electronically signed by Ona LITTIE Mandril, CMA 02/22/2024 8:36 AM    I agree the documentation is accurate and complete.  Electronically signed by: Tinnie Almarie Forts, PA-C 02/22/2024 9:28 AM

## 2024-04-05 NOTE — Telephone Encounter (Signed)
 Yes we recommend COVID vaccine for individuals 65 and older

## 2024-05-17 NOTE — Telephone Encounter (Signed)
 Yes they often will do a screening test at home that I have found to frequently given false positive results. Signs of any blockage of blood flow to the legs would include pain and cramping when walking that is relieved with rest. Since she is asymptomatic I think it is fine to wait to check her blood flow at her appt in December

## 2024-05-22 ENCOUNTER — Emergency Department (HOSPITAL_BASED_OUTPATIENT_CLINIC_OR_DEPARTMENT_OTHER)

## 2024-05-22 ENCOUNTER — Emergency Department (HOSPITAL_BASED_OUTPATIENT_CLINIC_OR_DEPARTMENT_OTHER)
Admission: EM | Admit: 2024-05-22 | Discharge: 2024-05-22 | Disposition: A | Discharge status: Home / Self Care | Attending: Emergency Medicine | Admitting: Emergency Medicine

## 2024-05-22 ENCOUNTER — Other Ambulatory Visit: Payer: Self-pay

## 2024-05-22 DIAGNOSIS — Z7984 Long term (current) use of oral hypoglycemic drugs: Secondary | ICD-10-CM | POA: Insufficient documentation

## 2024-05-22 DIAGNOSIS — I10 Essential (primary) hypertension: Secondary | ICD-10-CM | POA: Diagnosis not present

## 2024-05-22 DIAGNOSIS — Z7982 Long term (current) use of aspirin: Secondary | ICD-10-CM | POA: Insufficient documentation

## 2024-05-22 DIAGNOSIS — E119 Type 2 diabetes mellitus without complications: Secondary | ICD-10-CM | POA: Diagnosis not present

## 2024-05-22 DIAGNOSIS — Z79899 Other long term (current) drug therapy: Secondary | ICD-10-CM | POA: Diagnosis not present

## 2024-05-22 DIAGNOSIS — R1031 Right lower quadrant pain: Secondary | ICD-10-CM | POA: Diagnosis not present

## 2024-05-22 DIAGNOSIS — R1011 Right upper quadrant pain: Secondary | ICD-10-CM | POA: Diagnosis present

## 2024-05-22 DIAGNOSIS — Z7951 Long term (current) use of inhaled steroids: Secondary | ICD-10-CM | POA: Diagnosis not present

## 2024-05-22 DIAGNOSIS — R10A1 Flank pain, right side: Secondary | ICD-10-CM

## 2024-05-22 DIAGNOSIS — J45909 Unspecified asthma, uncomplicated: Secondary | ICD-10-CM | POA: Diagnosis not present

## 2024-05-22 LAB — COMPREHENSIVE METABOLIC PANEL WITH GFR
ALT: 8 U/L (ref 0–44)
AST: 17 U/L (ref 15–41)
Albumin: 4.2 g/dL (ref 3.5–5.0)
Alkaline Phosphatase: 56 U/L (ref 38–126)
Anion gap: 11 (ref 5–15)
BUN: 13 mg/dL (ref 8–23)
CO2: 25 mmol/L (ref 22–32)
Calcium: 9.3 mg/dL (ref 8.9–10.3)
Chloride: 106 mmol/L (ref 98–111)
Creatinine, Ser: 0.79 mg/dL (ref 0.44–1.00)
GFR, Estimated: >60 mL/min (ref >60)
Glucose, Bld: 164 mg/dL — ABNORMAL HIGH (ref 70–99)
Potassium: 4 mmol/L (ref 3.5–5.1)
Sodium: 142 mmol/L (ref 135–145)
Total Bilirubin: 0.6 mg/dL (ref 0.0–1.2)
Total Protein: 7.5 g/dL (ref 6.5–8.1)

## 2024-05-22 LAB — CBC WITH DIFFERENTIAL/PLATELET
Abs Immature Granulocytes: 0.01 K/uL (ref 0.00–0.07)
Basophils Absolute: 0 K/uL (ref 0.0–0.1)
Basophils Relative: 0 %
Eosinophils Absolute: 0.1 K/uL (ref 0.0–0.5)
Eosinophils Relative: 1 %
HCT: 30 % — ABNORMAL LOW (ref 36.0–46.0)
Hemoglobin: 10.3 g/dL — ABNORMAL LOW (ref 12.0–15.0)
Immature Granulocytes: 0 %
Lymphocytes Relative: 40 %
Lymphs Abs: 1.8 K/uL (ref 0.7–4.0)
MCH: 31.1 pg (ref 26.0–34.0)
MCHC: 34.3 g/dL (ref 30.0–36.0)
MCV: 90.6 fL (ref 80.0–100.0)
Monocytes Absolute: 0.6 K/uL (ref 0.1–1.0)
Monocytes Relative: 12 %
Neutro Abs: 2 K/uL (ref 1.7–7.7)
Neutrophils Relative %: 47 %
Platelets: 299 K/uL (ref 150–400)
RBC: 3.31 MIL/uL — ABNORMAL LOW (ref 3.87–5.11)
RDW: 13.3 % (ref 11.5–15.5)
WBC: 4.5 K/uL (ref 4.0–10.5)
nRBC: 0 % (ref 0.0–0.2)

## 2024-05-22 LAB — URINALYSIS, W/ REFLEX TO CULTURE (INFECTION SUSPECTED)
Bilirubin Urine: NEGATIVE
Glucose, UA: NEGATIVE mg/dL
Hgb urine dipstick: NEGATIVE
Ketones, ur: NEGATIVE mg/dL
Leukocytes,Ua: NEGATIVE
Nitrite: NEGATIVE
Protein, ur: NEGATIVE mg/dL
Specific Gravity, Urine: 1.015 (ref 1.005–1.030)
pH: 7 (ref 5.0–8.0)

## 2024-05-22 LAB — LIPASE, BLOOD: Lipase: 19 U/L (ref 11–51)

## 2024-05-22 MED ORDER — CYCLOBENZAPRINE HCL 5 MG PO TABS: 5.0000 mg | ORAL_TABLET | Freq: Two times a day (BID) | ORAL | 0 refills | Status: AC | PRN

## 2024-05-22 MED ORDER — LIDOCAINE 5 % EX PTCH: 1.0000 | MEDICATED_PATCH | CUTANEOUS | 0 refills | Status: AC

## 2024-05-22 MED ORDER — IOHEXOL 300 MG/ML  SOLN
100.0000 mL | Freq: Once | INTRAMUSCULAR | Status: AC | PRN
  Administered 2024-05-22: 80 mL via INTRAVENOUS

## 2024-05-22 MED ORDER — AMLODIPINE BESYLATE 2.5 MG PO TABS: 2.5000 mg | ORAL_TABLET | Freq: Every day | ORAL | 0 refills | Status: AC

## 2024-05-22 MED ORDER — LIDOCAINE 5 % EX PTCH: 1.0000 | MEDICATED_PATCH | CUTANEOUS | Status: DC

## 2024-05-22 NOTE — ED Triage Notes (Signed)
 Pt states right side flank pain that started Friday went away and came back this morning, Denies N/V/D or fevers.

## 2024-05-22 NOTE — Progress Notes (Signed)
 Extravasation occurred on right elbow after CT abd/pelvis preformed at 8am. Pt given CT extravasation instruction sheet and told to elevate and keep cold and warm compress on area for next 24 hours. Pt in no distress or pain and understands to call if any issues arise.

## 2024-05-22 NOTE — ED Notes (Signed)
 Pt brought back by CT. CT tech states that pt IV infiltrated. EDP made aware. IV removed by this nurse. Warm pack applied to site.

## 2024-05-22 NOTE — ED Notes (Signed)
 Patient transported to CT

## 2024-05-22 NOTE — ED Notes (Signed)
 Ice pack applied to pt arm and educated about elevation.

## 2024-05-22 NOTE — ED Provider Notes (Signed)
 [place] [facility] Provider Note   CSN: [id number] Arrival date & time: 05/22/24  [id number]     Patient presents with: Flank Pain   Lisa Powell is a 83 y.o. female.   HPI     83 year old female with a history of diabetes, reactive airway disease, vitamin B12 deficiency who presents with concern for flank pain.   RUQ pain, had an episode Friday and improved, then this morning began again Pressure and sharp pain, ruq and going towards back Didn't notice if worse with deep breaths or eating No nausea or vomiting, fever, constipation or diarrhea, no dysuria or hematuria, no cp or dyspnea, no cough No hx of abdominal surgery with exception of cs BP high this AM, no hx of htn  Past Medical History:  Diagnosis Date   Anemia reactive airway disease   Calcific tendonitis    Diabetes mellitus without complication (HCC)    Open-angle glaucoma    Reactive airway disease    Vitamin B12 deficiency      Prior to Admission medications   Medication Sig Start Date End Date Taking? Authorizing Provider  amLODipine (NORVASC) 2.5 MG tablet Take 1 tablet (2.5 mg total) by mouth daily. 05/22/24  Yes Dreama Longs, MD  lidocaine  (LIDODERM ) 5 % Place 1 patch onto the skin daily. Remove & Discard patch within 12 hours or as directed by MD 05/22/24  Yes Dreama Longs, MD  aspirin  EC 81 MG tablet Take 81 mg by mouth daily.    [provider]  atorvastatin (LIPITOR) 40 MG tablet Take 40 mg by mouth daily.    [provider]  azelastine (OPTIVAR) 0.05 % ophthalmic solution 1 drop 2 (two) times daily.    [provider]  cyclobenzaprine  (FLEXERIL ) 5 MG tablet Take 1 tablet (5 mg total) by mouth 2 (two) times daily as needed for muscle spasms. 05/22/24   Dreama Longs, MD  ferrous sulfate 325 (65 FE) MG tablet Take 325 mg by mouth daily with breakfast.    [provider]  fluticasone (FLONASE) 50 MCG/ACT nasal spray Place  into both nostrils daily.    [provider]  latanoprost (XALATAN) 0.005 % ophthalmic solution 1 drop at bedtime.    [provider]  lisinopril (ZESTRIL) 5 MG tablet Take 5 mg by mouth daily.    [provider]  metFORMIN (GLUCOPHAGE) 1000 MG tablet Take 1,000 mg by mouth 2 (two) times daily with a meal.    [provider]  montelukast (SINGULAIR) 10 MG tablet Take 10 mg by mouth at bedtime.    [provider]    Allergies: Patient has no known allergies.    Review of Systems  Updated Vital Signs BP (!) 161/62   Pulse (!) 58   Temp 97.7 F (36.5 C) (Oral)   Resp (!) 25   Ht 5' 4 (1.626 m)   Wt 65.8 kg   SpO2 100%   BMI 24.89 kg/m   Physical Exam Vitals and nursing note reviewed.  Constitutional:      General: She is not in acute distress.    Appearance: She is well-developed. She is not diaphoretic.  HENT:     Head: Normocephalic and atraumatic.  Eyes:     Conjunctiva/sclera: Conjunctivae normal.  Cardiovascular:     Rate and Rhythm: Normal rate and regular rhythm.     Pulses: Normal pulses.  Pulmonary:     Effort: Pulmonary effort is normal. No respiratory distress.  Abdominal:     General: There is no distension.     Palpations: Abdomen is soft.     Tenderness: There is abdominal tenderness (RLQ). There is no right CVA tenderness, left CVA tenderness or guarding.  Musculoskeletal:        General: Tenderness (right side) present.     Cervical back: Normal range of motion.  Skin:    General: Skin is warm and dry.     Findings: No erythema or rash.  Neurological:     Mental Status: She is alert and oriented to person, place, and time.     (all labs ordered are listed, but only abnormal results are displayed) Labs Reviewed  CBC WITH DIFFERENTIAL/PLATELET - Abnormal; Notable for the following components:      Result Value   RBC 3.31 (*)    Hemoglobin 10.3 (*)    HCT 30.0 (*)    All other components within normal  limits  COMPREHENSIVE METABOLIC PANEL WITH GFR - Abnormal; Notable for the following components:   Glucose, Bld 164 (*)    All other components within normal limits  URINALYSIS, W/ REFLEX TO CULTURE (INFECTION SUSPECTED) - Abnormal; Notable for the following components:   Bacteria, UA RARE (*)    All other components within normal limits  LIPASE, BLOOD    EKG: EKG Interpretation Date/Time:  Sunday May 22 2024 07:20:37 EST Ventricular Rate:  67 PR Interval:  145 QRS Duration:  94 QT Interval:  424 QTC Calculation: 448 R Axis:   13  Text Interpretation: Sinus rhythm Abnormal R-wave progression, early transition LVH by voltage No significant change since last tracing Confirmed by Dreama Longs ([id number]) on 05/22/2024 7:23:54 AM  Radiology: [facility] Elbow 2 Views Right Result Date: 05/22/2024 EXAM: 1 or 2 VIEW(S) XRAY OF THE RIGHT ELBOW COMPARISON: None available. CLINICAL HISTORY: 855384 Pain 144615 Pain. FINDINGS: BONES AND JOINTS: No acute fracture. No focal osseous lesion. No joint dislocation. No joint effusion. SOFT TISSUES: There is radiopaque fluid within the soft tissues of the proximal forearm and distal arm present anteriorly and laterally, compatible with extravasation during contrast injection. IMPRESSION: 1. No acute abnormality. 2. Radiopaque fluid within the soft tissues of the proximal forearm and distal arm, compatible with extravasation during contrast injection. Electronically signed by: Evalene Coho MD 05/22/2024 10:06 AM EST RP Workstation: [id number]   CT ABDOMEN PELVIS W CONTRAST Result Date: 05/22/2024 EXAM: CT ABDOMEN AND PELVIS WITH CONTRAST 05/22/2024 08:36:52 AM TECHNIQUE: CT of the abdomen and pelvis was performed with the administration of 80 cc unremarkable intravenous contrast. Multiplanar reformatted images are provided for review. Automated exposure control, iterative reconstruction, and/or weight-based adjustment of the mA/kV was utilized to reduce the  radiation dose to as low as reasonably achievable. COMPARISON: None available. CLINICAL HISTORY: RLQ abdominal pain FINDINGS: Lack of contrast enhancement of internal organs due to contrast extravasation reported by the technologist. LOWER CHEST: No acute abnormality. LIVER: The liver is unremarkable. GALLBLADDER AND BILE DUCTS: Gallbladder is unremarkable. No biliary ductal dilatation. SPLEEN: No acute abnormality. PANCREAS: No acute abnormality. ADRENAL GLANDS: No acute abnormality. KIDNEYS, URETERS AND BLADDER: No stones in the kidneys or ureters. No hydronephrosis. No perinephric or periureteral stranding. Urinary bladder is unremarkable. GI AND BOWEL: Stomach demonstrates no acute abnormality. There is no bowel obstruction. Normal appendix visualized. Extensive colonic diverticulosis is seen, without signs of diverticulitis. PERITONEUM AND RETROPERITONEUM: No ascites. No free air. VASCULATURE: Aorta is normal in caliber. LYMPH NODES: No lymphadenopathy. REPRODUCTIVE ORGANS: No acute abnormality. BONES  AND SOFT TISSUES: No acute osseous abnormality. No focal soft tissue abnormality. IMPRESSION: 1. No evidence of appendicitis or other acute findings. 2. Diverticulosis without signs of diverticulitis. Electronically signed by: Norleen Kil MD 05/22/2024 09:15 AM EST RP Workstation: [id number]     Procedures   Medications Ordered in the ED  iohexol  (OMNIPAQUE ) 300 MG/ML solution 100 mL (80 mLs Intravenous Contrast Given 05/22/24 0811)                                     83 year old female with a history of diabetes, reactive airway disease, vitamin B12 deficiency who presents with concern for flank pain.  DDx includes appendicitis, pancreatitis, cholecystitis, pyelonephritis, nephrolithiasis, diverticulitis, AAA, aortic dissection, PE, ACS.  Denies chest pain or dyspnea, has lower abdominal tenderness and doubt acs/PE at this time. Normal bilateral pulses, no cp, and low suspicion for  dissection.  Labs completed and evaluated by me show mild anemia, no leukocytosis, no clinically significant electrolyte abnormalities, normal transaminases, no signs of pancreatitis.  Urinalysis without signs of urinary tract infection. EKG with normal sinus rhythm, low clinical suspicion for ACS.  CT abdomen pelvis performed and shows no signs of appendicitis or other acute findings.  Unremarkable gallbladder on CT.  Not having RUQ tenderness on reexamination and doubt cholecystitis.  At this time suspect likely muscular pain or disc disease.  Given rx for muscle relaxant, lidocaine  patch.  Has hypertension while in ED, will start low dose of amlodipine and recommend PCP follow up.  Discussed return precautions. Patient discharged in stable condition with understanding of reasons to return.      -She did have contrast extravasation with CT-evaluated without signs of compartment syndrome or skin changes, no pain. Discussed return precautions for this as well, recommend elevation, ice.        Final diagnoses:  Right flank pain  Right lower quadrant abdominal pain  Hypertension, unspecified type    ED Discharge Orders          Ordered    cyclobenzaprine  (FLEXERIL ) 5 MG tablet  2 times daily PRN        05/22/24 1001    amLODipine (NORVASC) 2.5 MG tablet  Daily        05/22/24 1001    lidocaine  (LIDODERM ) 5 %  Every 24 hours        05/22/24 1001               Dreama Longs, MD 05/22/24 2009
